# Patient Record
Sex: Female | Born: 2000 | Race: Black or African American | Hispanic: No | Marital: Single | Smoking: Never smoker
Health system: Southern US, Community
[De-identification: ages and names within clinical notes are randomized; demographics above are authoritative.]

---
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
## 2022-08-20 ENCOUNTER — Other Ambulatory Visit (HOSPITAL_COMMUNITY): Payer: Self-pay | Admitting: Internal Medicine

## 2022-08-20 DIAGNOSIS — R441 Visual hallucinations: Secondary | ICD-10-CM

## 2022-09-09 ENCOUNTER — Encounter (HOSPITAL_COMMUNITY): Payer: Self-pay

## 2022-09-09 ENCOUNTER — Ambulatory Visit (HOSPITAL_COMMUNITY): Payer: PRIVATE HEALTH INSURANCE
